# Patient Record
Sex: Female | Born: 1995 | Hispanic: No | Marital: Single
Health system: Southern US, Community
[De-identification: ages and names within clinical notes are randomized; demographics above are authoritative.]

## PROBLEM LIST (undated history)

## (undated) DIAGNOSIS — D649 Anemia, unspecified: Secondary | ICD-10-CM

---
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
## 2019-03-24 ENCOUNTER — Emergency Department (HOSPITAL_COMMUNITY)
Admission: EM | Admit: 2019-03-24 | Discharge: 2019-03-24 | Disposition: A | Discharge status: Home / Self Care | Payer: Self-pay | Attending: Emergency Medicine | Admitting: Emergency Medicine

## 2019-03-24 ENCOUNTER — Other Ambulatory Visit: Payer: Self-pay

## 2019-03-24 ENCOUNTER — Encounter (HOSPITAL_COMMUNITY): Payer: Self-pay | Admitting: Emergency Medicine

## 2019-03-24 DIAGNOSIS — J029 Acute pharyngitis, unspecified: Secondary | ICD-10-CM | POA: Insufficient documentation

## 2019-03-24 DIAGNOSIS — R05 Cough: Secondary | ICD-10-CM | POA: Insufficient documentation

## 2019-03-24 HISTORY — DX: Anemia, unspecified: D64.9

## 2019-03-24 LAB — GROUP A STREP BY PCR: Group A Strep by PCR: NOT DETECTED

## 2019-03-24 MED ORDER — DEXAMETHASONE SODIUM PHOSPHATE 10 MG/ML IJ SOLN
10.0000 mg | Freq: Once | INTRAMUSCULAR | Status: AC
  Administered 2019-03-24: 10 mg via INTRAMUSCULAR
  Filled 2019-03-24: qty 1

## 2019-03-24 MED ORDER — KETOROLAC TROMETHAMINE 15 MG/ML IJ SOLN
15.0000 mg | Freq: Once | INTRAMUSCULAR | Status: AC
  Administered 2019-03-24: 15 mg via INTRAMUSCULAR
  Filled 2019-03-24: qty 1

## 2019-03-24 MED ORDER — PREDNISONE 20 MG PO TABS: 40.0000 mg | ORAL_TABLET | Freq: Every day | ORAL | 0 refills | Status: DC

## 2019-03-24 NOTE — Discharge Instructions (Signed)
Please read and follow all provided instructions.  Your diagnoses today include:  1. Exudative pharyngitis     Tests performed today include:  Strep test: was negative for strep throat  Vital signs. See below for your results today.   Medications prescribed:   Prednisone - steroid medicine   It is best to take this medication in the morning to prevent sleeping problems. If you are diabetic, monitor your blood sugar closely and stop taking Prednisone if blood sugar is over 300. Take with food to prevent stomach upset.   Home care instructions:  Please read the educational materials provided and follow any instructions contained in this packet.  Follow-up instructions: Please follow-up with your primary care provider as needed for further evaluation of your symptoms.  Return instructions:   Please return to the Emergency Department if you experience worsening symptoms.   Return if you have worsening problems swallowing, your neck becomes swollen, you cannot swallow your saliva or your voice becomes muffled.   Return with high persistent fever, persistent vomiting, or if you have trouble breathing.   Please return if you have any other emergent concerns.  Additional Information:  Your vital signs today were: BP 119/88 (BP Location: Left Arm)   Pulse 77   Temp 99.2 F (37.3 C) (Oral)   Resp 16   SpO2 100%  If your blood pressure (BP) was elevated above 135/85 this visit, please have this repeated by your doctor within one month. --------------

## 2019-03-24 NOTE — ED Notes (Signed)
Patient verbalizes understanding of discharge instructions. Opportunity for questioning and answers were provided. Armband removed by staff, pt discharged from ED to home via POV  

## 2019-03-24 NOTE — ED Triage Notes (Signed)
Pt reports sore throat and swollen tonsils with exudate since last night. Also endorses headache but no other symptoms.

## 2019-03-24 NOTE — ED Provider Notes (Signed)
[facility] Provider Note   CSN: [id number] Arrival date & time: 03/24/19  1314     History Chief Complaint  Patient presents with  . Sore Throat    Nicole Novak is a 24 y.o. female.  Patient with past history of recurrent tonsillitis presents to the emergency department complaint of sore throat starting last night.  Patient has had decreased fluid and oral intake due to pain.  She is able to breathe without any difficulty.  She denies any fevers.  She has a mild cough.  No pain with movement of her neck.  No nausea, vomiting, diarrhea.  She states is been difficult to swallow because of the pain. The onset of this condition was acute. The course is constant.         Past Medical History:  Diagnosis Date  . Anemia     There are no problems to display for this patient.   History reviewed. No pertinent surgical history.   OB History   No obstetric history on file.     No family history on file.  Social History   Tobacco Use  . Smoking status: Not on file  Substance Use Topics  . Alcohol use: Not on file  . Drug use: Not on file    Home Medications Prior to Admission medications   Not on File    Allergies    Shellfish allergy  Review of Systems   Review of Systems  Constitutional: Negative for chills, fatigue and fever.  HENT: Positive for sore throat. Negative for congestion, ear pain, rhinorrhea and sinus pressure.   Eyes: Negative for redness.  Respiratory: Positive for cough. Negative for wheezing.   Gastrointestinal: Negative for abdominal pain, diarrhea, nausea and vomiting.  Genitourinary: Negative for dysuria.  Musculoskeletal: Negative for myalgias and neck stiffness.  Skin: Negative for rash.  Neurological: Negative for headaches.  Hematological: Negative for adenopathy.    Physical Exam Updated Vital Signs BP 119/88 (BP Location: Left Arm)   Pulse 77   Temp 99.2 F (37.3 C) (Oral)   Resp 16    SpO2 100%   Physical Exam Vitals and nursing note reviewed.  Constitutional:      Appearance: She is well-developed.  HENT:     Head: Normocephalic and atraumatic.     Nose: No congestion.     Mouth/Throat:     Mouth: Mucous membranes are moist.     Pharynx: Oropharyngeal exudate present. No uvula swelling.     Comments: No uvular deviation or signs of peritonsillar abscess. Eyes:     General:        Right eye: No discharge.        Left eye: No discharge.     Conjunctiva/sclera: Conjunctivae normal.  Neck:     Comments: Full range of motion of neck without any significant discomfort. Cardiovascular:     Rate and Rhythm: Normal rate and regular rhythm.     Heart sounds: Normal heart sounds.  Pulmonary:     Effort: Pulmonary effort is normal.     Breath sounds: Normal breath sounds.  Abdominal:     Palpations: Abdomen is soft.     Tenderness: There is no abdominal tenderness.  Musculoskeletal:     Cervical back: Normal range of motion and neck supple.  Skin:    General: Skin is warm and dry.  Neurological:     Mental Status: She is alert.     ED Results / Procedures /  Treatments   Labs (all labs ordered are listed, but only abnormal results are displayed) Labs Reviewed  GROUP A STREP BY PCR    EKG None  Radiology No results found.  Procedures Procedures (including critical care time)  Medications Ordered in ED Medications  ketorolac (TORADOL) 15 MG/ML injection 15 mg (15 mg Intramuscular Given 03/24/19 1540)  dexamethasone (DECADRON) injection 10 mg (10 mg Intramuscular Given 03/24/19 1542)    ED Course  I have reviewed the triage vital signs and the nursing notes.  Pertinent labs & imaging results that were available during my care of the patient were reviewed by me and considered in my medical decision making (see chart for details).  Patient seen and examined.  Strep negative.  Will give IM Toradol and Decadron, p.o. challenge.  Anticipate discharged  home with symptomatic treatment.  Vital signs reviewed and are as follows: BP 119/88 (BP Location: Left Arm)   Pulse 77   Temp 99.2 F (37.3 C) (Oral)   Resp 16   SpO2 100%   4:34 PM patient received medications and is feeling a bit better.  She was able to drink a cup of juice.  Plan for discharged home.  Encouraged return with worsening pain, uncontrolled pain, inability to swallow, fever, pain with movement.     MDM Rules/Calculators/A&P                      Patient with exudative pharyngitis.  No signs of deep space infection of the neck.  1-2/4 Centor criteria.  Medications for antibiotics at this time.  No signs of peritonsillar abscess.  Doubt retropharyngeal abscess, epiglottitis, gonococcal pharyngitis.   Final Clinical Impression(s) / ED Diagnoses Final diagnoses:  Exudative pharyngitis    Rx / DC Orders ED Discharge Orders         Ordered    predniSONE (DELTASONE) 20 MG tablet  Daily     03/24/19 1632           Renne Crigler, PA-C 03/24/19 1635    Terrilee Files, MD 03/25/19 1133

## 2019-08-16 ENCOUNTER — Ambulatory Visit (HOSPITAL_COMMUNITY)
Admission: EM | Admit: 2019-08-16 | Discharge: 2019-08-16 | Disposition: A | Discharge status: Home / Self Care | Payer: Self-pay

## 2019-08-16 ENCOUNTER — Encounter (HOSPITAL_COMMUNITY): Payer: Self-pay | Admitting: Emergency Medicine

## 2019-08-16 ENCOUNTER — Other Ambulatory Visit: Payer: Self-pay

## 2019-08-16 DIAGNOSIS — Z3202 Encounter for pregnancy test, result negative: Secondary | ICD-10-CM

## 2019-08-16 DIAGNOSIS — N898 Other specified noninflammatory disorders of vagina: Secondary | ICD-10-CM | POA: Insufficient documentation

## 2019-08-16 LAB — POCT URINALYSIS DIP (DEVICE)
Glucose, UA: 100 mg/dL — AB
Hgb urine dipstick: NEGATIVE
Ketones, ur: NEGATIVE mg/dL
Leukocytes,Ua: NEGATIVE
Nitrite: NEGATIVE
Protein, ur: NEGATIVE mg/dL
Specific Gravity, Urine: >=1.03 (ref 1.005–1.030)
Urobilinogen, UA: 0.2 mg/dL (ref 0.0–1.0)
pH: 5.5 (ref 5.0–8.0)

## 2019-08-16 LAB — POC URINE PREG, ED: Preg Test, Ur: NEGATIVE

## 2019-08-16 MED ORDER — FLUCONAZOLE 150 MG PO TABS
150.0000 mg | ORAL_TABLET | Freq: Once | ORAL | 0 refills | Status: AC
Start: 2019-08-16 — End: 2019-08-16

## 2019-08-16 MED ORDER — METRONIDAZOLE 500 MG PO TABS
500.0000 mg | ORAL_TABLET | Freq: Two times a day (BID) | ORAL | 0 refills | Status: AC
Start: 2019-08-16 — End: 2019-08-23

## 2019-08-16 NOTE — Discharge Instructions (Signed)
Begin metronidazole and diflucan for BV and yeast, I will call to let you know results of urine  We are testing you for Gonorrhea, Chlamydia, Trichomonas, Yeast and Bacterial Vaginosis. We will call you if anything is positive and let you know if you require any further treatment. Please inform partners of any positive results.   Please return if symptoms not improving with treatment, development of fever, nausea, vomiting, abdominal pain.

## 2019-08-16 NOTE — ED Triage Notes (Signed)
Pt c/o vaginal odor and discharge x 1 week. She states she has had a new partner and frequently gets BV.

## 2019-08-16 NOTE — ED Provider Notes (Signed)
[facility]    CSN: [id number] Arrival date & time: 08/16/19  1123      History   Chief Complaint Chief Complaint  Patient presents with   Vaginitis    HPI Nicole Novak is a 24 y.o. female no significant past medical history presenting today for evaluation of odor and discharge.  Patient reports over the past week she has had increased discharge and odor which feels similar to prior bacterial vaginosis.  She reports that she gets this frequently and she recently has a new partner which she feels has triggered it.  She also is concerned about possible UTI as she has has had sensation of incomplete voiding.  Has history of recurrent UTI as well.  Denies fever, nausea vomiting or abdominal pain.  Denies back pain.  Denies any rashes or lesions.  HPI  Past Medical History:  Diagnosis Date   Anemia     There are no problems to display for this patient.   History reviewed. No pertinent surgical history.  OB History   No obstetric history on file.      Home Medications    Prior to Admission medications   Medication Sig Start Date End Date Taking? Authorizing Provider  Norethin Ace-Eth Estrad-FE (LOESTRIN FE 1/20 PO) Take by mouth.   Yes [provider]  fluconazole (DIFLUCAN) 150 MG tablet Take 1 tablet (150 mg total) by mouth once for 1 dose. 08/16/19 08/16/19  Jamont Mellin C, PA-C  metroNIDAZOLE (FLAGYL) 500 MG tablet Take 1 tablet (500 mg total) by mouth 2 (two) times daily for 7 days. 08/16/19 08/23/19  Darl Brisbin, Elesa Hacker, PA-C    Family History No family history on file.  Social History Social History   Tobacco Use   Smoking status: Not on file  Substance Use Topics   Alcohol use: Not on file   Drug use: Not on file     Allergies   Shellfish allergy   Review of Systems Review of Systems  Constitutional: Negative for fever.  Respiratory: Negative for shortness of breath.   Cardiovascular: Negative for chest pain.   Gastrointestinal: Negative for abdominal pain, diarrhea, nausea and vomiting.  Genitourinary: Positive for difficulty urinating, dysuria and vaginal discharge. Negative for flank pain, genital sores, hematuria, menstrual problem, vaginal bleeding and vaginal pain.  Musculoskeletal: Negative for back pain.  Skin: Negative for rash.  Neurological: Negative for dizziness, light-headedness and headaches.     Physical Exam Triage Vital Signs ED Triage Vitals  Enc Vitals Group     BP 08/16/19 1217 110/68     Pulse --      Resp 08/16/19 1217 16     Temp 08/16/19 1217 98.7 F (37.1 C)     Temp Source 08/16/19 1217 Oral     SpO2 08/16/19 1217 100 %     Weight --      Height --      Head Circumference --      Peak Flow --      Pain Score 08/16/19 1213 0     Pain Loc --      Pain Edu? --      Excl. in [place]? --    No data found.  Updated Vital Signs BP 110/68 (BP Location: Right Arm)    Temp 98.7 F (37.1 C) (Oral)    Resp 16    LMP 08/02/2019    SpO2 100%   Visual Acuity Right Eye Distance:   Left Eye Distance:  Bilateral Distance:    Right Eye Near:   Left Eye Near:    Bilateral Near:     Physical Exam Vitals and nursing note reviewed.  Constitutional:      Appearance: She is well-developed.     Comments: No acute distress  HENT:     Head: Normocephalic and atraumatic.     Nose: Nose normal.  Eyes:     Conjunctiva/sclera: Conjunctivae normal.  Cardiovascular:     Rate and Rhythm: Normal rate.  Pulmonary:     Effort: Pulmonary effort is normal. No respiratory distress.  Abdominal:     General: There is no distension.     Comments: Soft, nondistended, mild tenderness to palpation to left lower quadrant and mid to suprapubic area, negative rebound, negative Rovsing, negative McBurney's  Musculoskeletal:        General: Normal range of motion.     Cervical back: Neck supple.  Skin:    General: Skin is warm and dry.  Neurological:     Mental Status: She is alert  and oriented to person, place, and time.      UC Treatments / Results  Labs (all labs ordered are listed, but only abnormal results are displayed) Labs Reviewed  POCT URINALYSIS DIP (DEVICE) - Abnormal; Notable for the following components:      Result Value   Glucose, UA 100 (*)    Bilirubin Urine SMALL (*)    All other components within normal limits  URINE CULTURE  POC URINE PREG, ED  CERVICOVAGINAL ANCILLARY ONLY  CYTOLOGY, (ORAL, ANAL, URETHRAL) ANCILLARY ONLY    EKG   Radiology No results found.  Procedures Procedures (including critical care time)  Medications Ordered in UC Medications - No data to display  Initial Impression / Assessment and Plan / UC Course  I have reviewed the triage vital signs and the nursing notes.  Pertinent labs & imaging results that were available during my care of the patient were reviewed by me and considered in my medical decision making (see chart for details).  Clinical Course as of Aug 16 1354  Fri Aug 16, 2019  1320 Urine culture [HW]    Clinical Course User Index [HW] Myelle Poteat, Verdel C, PA-C    Empirically treating for BV and yeast today with metronidazole and.  Vaginal swab pending for further evaluation and confirmation of BV in regards to discharge.  Screening for STDs and pharynx.  UA unremarkable, will send for culture to further r/o UTI as patient has been taking azo.  Discussed strict return precautions. Patient verbalized understanding and is agreeable with plan.  Final Clinical Impressions(s) / UC Diagnoses   Final diagnoses:  Vaginal discharge     Discharge Instructions     Begin metronidazole and diflucan for BV and yeast, I will call to let you know results of urine  We are testing you for Gonorrhea, Chlamydia, Trichomonas, Yeast and Bacterial Vaginosis. We will call you if anything is positive and let you know if you require any further treatment. Please inform partners of any positive results.    Please return if symptoms not improving with treatment, development of fever, nausea, vomiting, abdominal pain.     ED Prescriptions    Medication Sig Dispense Auth. Provider   metroNIDAZOLE (FLAGYL) 500 MG tablet Take 1 tablet (500 mg total) by mouth 2 (two) times daily for 7 days. 14 tablet Isma Tietje C, PA-C   fluconazole (DIFLUCAN) 150 MG tablet Take 1 tablet (150 mg total) by  mouth once for 1 dose. 2 tablet Jernard Reiber, [place] C, PA-C     PDMP not reviewed this encounter.   Lew Dawes, PA-C 08/16/19 1357

## 2019-08-17 LAB — URINE CULTURE: Culture: <10000 — AB

## 2019-08-19 LAB — CERVICOVAGINAL ANCILLARY ONLY
Bacterial Vaginitis (gardnerella): POSITIVE — AB
Candida Glabrata: NEGATIVE
Candida Vaginitis: NEGATIVE
Chlamydia: NEGATIVE
Comment: NEGATIVE
Comment: NEGATIVE
Comment: NEGATIVE
Comment: NEGATIVE
Comment: NEGATIVE
Comment: NORMAL
Neisseria Gonorrhea: NEGATIVE
Trichomonas: NEGATIVE

## 2019-08-19 LAB — CYTOLOGY, (ORAL, ANAL, URETHRAL) ANCILLARY ONLY
Chlamydia: NEGATIVE
Comment: NEGATIVE
Comment: NEGATIVE
Comment: NORMAL
Neisseria Gonorrhea: NEGATIVE
Trichomonas: NEGATIVE

## 2019-10-31 ENCOUNTER — Ambulatory Visit: Payer: Self-pay

## 2019-11-07 ENCOUNTER — Emergency Department (HOSPITAL_COMMUNITY)
Admission: EM | Admit: 2019-11-07 | Discharge: 2019-11-07 | Disposition: A | Discharge status: Home / Self Care | Payer: Self-pay | Attending: Emergency Medicine | Admitting: Emergency Medicine

## 2019-11-07 ENCOUNTER — Emergency Department (HOSPITAL_COMMUNITY): Payer: Self-pay

## 2019-11-07 ENCOUNTER — Other Ambulatory Visit: Payer: Self-pay

## 2019-11-07 DIAGNOSIS — Y939 Activity, unspecified: Secondary | ICD-10-CM | POA: Insufficient documentation

## 2019-11-07 DIAGNOSIS — Y999 Unspecified external cause status: Secondary | ICD-10-CM | POA: Insufficient documentation

## 2019-11-07 DIAGNOSIS — G930 Cerebral cysts: Secondary | ICD-10-CM

## 2019-11-07 DIAGNOSIS — E876 Hypokalemia: Secondary | ICD-10-CM

## 2019-11-07 DIAGNOSIS — Y929 Unspecified place or not applicable: Secondary | ICD-10-CM | POA: Insufficient documentation

## 2019-11-07 DIAGNOSIS — T1490XA Injury, unspecified, initial encounter: Secondary | ICD-10-CM

## 2019-11-07 DIAGNOSIS — Z20822 Contact with and (suspected) exposure to covid-19: Secondary | ICD-10-CM | POA: Insufficient documentation

## 2019-11-07 DIAGNOSIS — S30810A Abrasion of lower back and pelvis, initial encounter: Secondary | ICD-10-CM

## 2019-11-07 DIAGNOSIS — R0781 Pleurodynia: Secondary | ICD-10-CM | POA: Insufficient documentation

## 2019-11-07 LAB — COMPREHENSIVE METABOLIC PANEL
ALT: 17 U/L (ref 0–44)
AST: 21 U/L (ref 15–41)
Albumin: 4 g/dL (ref 3.5–5.0)
Alkaline Phosphatase: 48 U/L (ref 38–126)
Anion gap: 13 (ref 5–15)
BUN: 12 mg/dL (ref 6–20)
CO2: 19 mmol/L — ABNORMAL LOW (ref 22–32)
Calcium: 9.2 mg/dL (ref 8.9–10.3)
Chloride: 105 mmol/L (ref 98–111)
Creatinine, Ser: 1.03 mg/dL — ABNORMAL HIGH (ref 0.44–1.00)
GFR calc Af Amer: >60 mL/min (ref >60)
GFR calc non Af Amer: >60 mL/min (ref >60)
Glucose, Bld: 118 mg/dL — ABNORMAL HIGH (ref 70–99)
Potassium: 2.8 mmol/L — ABNORMAL LOW (ref 3.5–5.1)
Sodium: 137 mmol/L (ref 135–145)
Total Bilirubin: 0.8 mg/dL (ref 0.3–1.2)
Total Protein: 6.7 g/dL (ref 6.5–8.1)

## 2019-11-07 LAB — CBC
HCT: 36.3 % (ref 36.0–46.0)
Hemoglobin: 12.1 g/dL (ref 12.0–15.0)
MCH: 27.9 pg (ref 26.0–34.0)
MCHC: 33.3 g/dL (ref 30.0–36.0)
MCV: 83.8 fL (ref 80.0–100.0)
Platelets: 355 10*3/uL (ref 150–400)
RBC: 4.33 MIL/uL (ref 3.87–5.11)
RDW: 12.4 % (ref 11.5–15.5)
WBC: 7.8 10*3/uL (ref 4.0–10.5)

## 2019-11-07 LAB — I-STAT BETA HCG BLOOD, ED (MC, WL, AP ONLY): I-stat hCG, quantitative: <5 m[IU]/mL (ref <5)

## 2019-11-07 LAB — LACTIC ACID, PLASMA: Lactic Acid, Venous: 1.9 mmol/L (ref 0.5–1.9)

## 2019-11-07 LAB — SARS CORONAVIRUS 2 BY RT PCR (HOSPITAL ORDER, PERFORMED IN ~~LOC~~ HOSPITAL LAB): SARS Coronavirus 2: NEGATIVE

## 2019-11-07 LAB — I-STAT CHEM 8, ED
BUN: 13 mg/dL (ref 6–20)
Calcium, Ion: 1.06 mmol/L — ABNORMAL LOW (ref 1.15–1.40)
Chloride: 106 mmol/L (ref 98–111)
Creatinine, Ser: 0.8 mg/dL (ref 0.44–1.00)
Glucose, Bld: 115 mg/dL — ABNORMAL HIGH (ref 70–99)
HCT: 35 % — ABNORMAL LOW (ref 36.0–46.0)
Hemoglobin: 11.9 g/dL — ABNORMAL LOW (ref 12.0–15.0)
Potassium: 2.9 mmol/L — ABNORMAL LOW (ref 3.5–5.1)
Sodium: 140 mmol/L (ref 135–145)
TCO2: 20 mmol/L — ABNORMAL LOW (ref 22–32)

## 2019-11-07 LAB — ETHANOL: Alcohol, Ethyl (B): <10 mg/dL (ref <10)

## 2019-11-07 LAB — PROTIME-INR
INR: 1.2 (ref 0.8–1.2)
Prothrombin Time: 14.8 seconds (ref 11.4–15.2)

## 2019-11-07 LAB — SAMPLE TO BLOOD BANK

## 2019-11-07 MED ORDER — POTASSIUM CHLORIDE CRYS ER 20 MEQ PO TBCR
40.0000 meq | EXTENDED_RELEASE_TABLET | Freq: Once | ORAL | Status: AC
  Administered 2019-11-07: 40 meq via ORAL
  Filled 2019-11-07: qty 2

## 2019-11-07 MED ORDER — LACTATED RINGERS IV BOLUS
1000.0000 mL | Freq: Once | INTRAVENOUS | Status: AC
  Administered 2019-11-07: 1000 mL via INTRAVENOUS

## 2019-11-07 MED ORDER — OXYCODONE-ACETAMINOPHEN 5-325 MG PO TABS: 2.0000 | ORAL_TABLET | Freq: Once | ORAL | Status: DC

## 2019-11-07 MED ORDER — HYDROMORPHONE HCL 1 MG/ML IJ SOLN
1.0000 mg | Freq: Once | INTRAMUSCULAR | Status: AC
  Administered 2019-11-07: 1 mg via INTRAVENOUS
  Filled 2019-11-07: qty 1

## 2019-11-07 MED ORDER — POTASSIUM CHLORIDE CRYS ER 20 MEQ PO TBCR: 20.0000 meq | EXTENDED_RELEASE_TABLET | Freq: Every day | ORAL | 0 refills | Status: AC

## 2019-11-07 MED ORDER — POTASSIUM CHLORIDE 10 MEQ/100ML IV SOLN
10.0000 meq | INTRAVENOUS | Status: AC
  Administered 2019-11-07 (×2): 10 meq via INTRAVENOUS
  Filled 2019-11-07 (×2): qty 100

## 2019-11-07 MED ORDER — HYDROCODONE-ACETAMINOPHEN 5-325 MG PO TABS: 1.0000 | ORAL_TABLET | Freq: Four times a day (QID) | ORAL | 0 refills | Status: AC | PRN

## 2019-11-07 MED ORDER — HYDROMORPHONE HCL 1 MG/ML IJ SOLN
INTRAMUSCULAR | Status: AC
  Filled 2019-11-07: qty 1

## 2019-11-07 MED ORDER — IOHEXOL 300 MG/ML  SOLN
100.0000 mL | Freq: Once | INTRAMUSCULAR | Status: AC | PRN
  Administered 2019-11-07: 100 mL via INTRAVENOUS

## 2019-11-07 MED ORDER — HYDROMORPHONE HCL 1 MG/ML IJ SOLN
1.0000 mg | Freq: Once | INTRAMUSCULAR | Status: AC
  Administered 2019-11-07: 1 mg via INTRAVENOUS

## 2019-11-07 NOTE — Progress Notes (Signed)
At Pt discharge, CSW was able to contact boyfriend via Facebook to ascertain his physical location at the [employer]. CSW coordinated with boyfriend and arranged transportation to [employer].
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
## 2019-11-07 NOTE — ED Notes (Signed)
Back from ct.

## 2019-11-07 NOTE — Progress Notes (Signed)
Orthopedic Tech Progress Note Patient Details:  Nicole Novak [birth year] [id number] Level 2 Trauma Patient ID: [name], female   DOB: 1995/11/08, 24 y.o.   MRN: [id number]   [name] 11/07/2019, 5:17 PM

## 2019-11-07 NOTE — Social Work (Signed)
CSW met with Pt at bedside. CSW and Pt worked to reach Pt's boyfriend via [place]. Boyfriend, Darrick Engineer, water, is still at work. Pt and boyfriend have alternate address of friend should Pt get discharged before Mr. Smith Mince arrives home:  [place].  CSW is able to contact boyfriend via Facebook.  CSW will continue to follow for discharge needs.

## 2019-11-07 NOTE — Discharge Instructions (Signed)
Your scan showed no posttraumatic injuries.  You may have a cyst in your brain that needs MRI outpatient.    Your potassium is low so please take potassium as prescribed.  Repeat potassium level in a week.  Take Tylenol for pain and take hydrocodone for severe pain.  See your doctor for follow-up  Return to ER if you have worse abdominal pain, headache, vomiting.
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
## 2019-11-11 ENCOUNTER — Telehealth: Payer: No Typology Code available for payment source | Admitting: Nurse Practitioner

## 2019-11-11 DIAGNOSIS — N3000 Acute cystitis without hematuria: Secondary | ICD-10-CM

## 2019-11-11 MED ORDER — NITROFURANTOIN MONOHYD MACRO 100 MG PO CAPS
100.0000 mg | ORAL_CAPSULE | Freq: Two times a day (BID) | ORAL | 0 refills | Status: DC
Start: 2019-11-11 — End: 2021-08-03

## 2019-11-11 NOTE — Progress Notes (Signed)

## 2019-11-30 ENCOUNTER — Telehealth: Payer: No Typology Code available for payment source | Admitting: Nurse Practitioner

## 2019-11-30 DIAGNOSIS — N3001 Acute cystitis with hematuria: Secondary | ICD-10-CM

## 2019-11-30 MED ORDER — SULFAMETHOXAZOLE-TRIMETHOPRIM 800-160 MG PO TABS: 1.0000 | ORAL_TABLET | Freq: Two times a day (BID) | ORAL | 0 refills | Status: DC

## 2019-11-30 MED ORDER — PHENAZOPYRIDINE HCL 100 MG PO TABS
100.0000 mg | ORAL_TABLET | Freq: Three times a day (TID) | ORAL | 0 refills | Status: DC | PRN
Start: 2019-11-30 — End: 2019-11-30

## 2019-11-30 MED ORDER — PHENAZOPYRIDINE HCL 100 MG PO TABS: 100.0000 mg | ORAL_TABLET | Freq: Three times a day (TID) | ORAL | 0 refills | Status: DC | PRN

## 2019-11-30 MED ORDER — SULFAMETHOXAZOLE-TRIMETHOPRIM 800-160 MG PO TABS
1.0000 | ORAL_TABLET | Freq: Two times a day (BID) | ORAL | 0 refills | Status: DC
Start: 2019-11-30 — End: 2019-11-30

## 2019-11-30 NOTE — Progress Notes (Signed)
We are sorry that you are not feeling well.  Here is how we plan to help!  Based on what you shared with me it looks like you most likely have a simple urinary tract infection.  A UTI (Urinary Tract Infection) is a bacterial infection of the bladder.  Most cases of urinary tract infections are simple to treat but a key part of your care is to encourage you to drink plenty of fluids and watch your symptoms carefully.  I have prescribed Bactrim DS One tablet twice a day for 7 days. And pyridium 3x a day for 3 days. Your symptoms should gradually improve. Call us if the burning in your urine worsens, you develop worsening fever, back pain or pelvic pain or if your symptoms do not resolve after completing the antibiotic.  Urinary tract infections can be prevented by drinking plenty of water to keep your body hydrated.  Also be sure when you wipe, wipe from front to back and don't hold it in!  If possible, empty your bladder every 4 hours.  Your e-visit answers were reviewed by a board certified advanced clinical practitioner to complete your personal care plan.  Depending on the condition, your plan could have included both over the counter or prescription medications.  If there is a problem please reply  once you have received a response from your provider.  Your safety is important to [place].  If you have drug allergies check your prescription carefully.    You can use MyChart to ask questions about today's visit, request a non-urgent call back, or ask for a work or school excuse for 24 hours related to this e-Visit. If it has been greater than 24 hours you will need to follow up with your provider, or enter a new e-Visit to address those concerns.   You will get an e-mail in the next two days asking about your experience.  I hope that your e-visit has been valuable and will speed your recovery. Thank you for using e-visits.   5-10 minutes spent reviewing and documenting in chart.

## 2019-11-30 NOTE — Addendum Note (Signed)
Addended by: Bennie Pierini on: 11/30/2019 07:46 PM   Modules accepted: Orders

## 2020-04-16 ENCOUNTER — Ambulatory Visit: Payer: Self-pay

## 2020-04-16 ENCOUNTER — Telehealth: Payer: No Typology Code available for payment source | Admitting: Physician Assistant

## 2020-04-16 DIAGNOSIS — N760 Acute vaginitis: Secondary | ICD-10-CM

## 2020-04-16 MED ORDER — FLUCONAZOLE 150 MG PO TABS
150.0000 mg | ORAL_TABLET | Freq: Once | ORAL | 0 refills | Status: AC
Start: 2020-04-16 — End: 2020-04-16

## 2020-04-16 MED ORDER — METRONIDAZOLE 500 MG PO TABS
500.0000 mg | ORAL_TABLET | Freq: Two times a day (BID) | ORAL | 0 refills | Status: DC
Start: 2020-04-16 — End: 2020-08-14

## 2020-04-16 NOTE — Progress Notes (Signed)
Hi Nicole Novak, Flagyl does not treat common UTIs; however, because you are having vaginal symptoms (discharge and itching), rather than urinary symptoms (burning, frequency), it is best to try the prescribed medications first to help prevent overmedicating.   If your symptoms persist or worsen after completing the Flagyl and Diflucan sent to your pharmacy today, it is recommended you follow up in person with a primary care provider, gynecologist, health department or urgent care for further testing and treatment.

## 2020-04-16 NOTE — Progress Notes (Signed)
Thank you for that information, Daryl!   It is important to have routine STD testing done.  Please read below for your treatment plan.  If your symptoms fail to improve following treatment, please go to an Urgent Care for testing.    Also, please consider establishing care with a GYN or Primary Care Provider. If you get BV infections often, there are other treatment methods to consider. Also, you may be due for cervical cancer screening.  (If you do not have a PCP, [facility] offers a free physician referral service available at [phone]. Our trained staff has the experience, knowledge and resources to put you in touch with a physician who is right for you.)    Based on what you shared with me it looks like you: May have a vaginosis due to bacteria  Vaginosis is an inflammation of the vagina that can result in discharge, itching and pain. The cause is usually a change in the normal balance of vaginal bacteria or an infection. Vaginosis can also result from reduced estrogen levels after menopause.  The most common causes of vaginosis are:   Bacterial vaginosis which results from an overgrowth of one on several organisms that are normally present in your vagina.   Yeast infections which are caused by a naturally occurring fungus called candida.   Vaginal atrophy (atrophic vaginosis) which results from the thinning of the vagina from reduced estrogen levels after menopause.   Trichomoniasis which is caused by a parasite and is commonly transmitted by sexual intercourse.  Factors that increase your risk of developing vaginosis include: Marland Kitchen Medications, such as antibiotics and steroids . Uncontrolled diabetes . Use of hygiene products such as bubble bath, vaginal spray or vaginal deodorant . Douching . Wearing damp or tight-fitting clothing . Using an intrauterine device (IUD) for birth control . Hormonal changes, such as those associated with pregnancy, birth control pills or  menopause . Sexual activity . Having a sexually transmitted infection  Your treatment plan is Metronidazole or Flagyl 500mg  twice a day for 7 days.  I have electronically sent this prescription into the pharmacy that you have chosen.  I will also prescribe Diflucan in case you develop signs of a yeast infection following treatment.   Be sure to take all of the medication as directed. Stop taking any medication if you develop a rash, tongue swelling or shortness of breath. Mothers who are breast feeding should consider pumping and discarding their breast milk while on these antibiotics. However, there is no consensus that infant exposure at these doses would be harmful.  Remember that medication creams can weaken latex condoms.   HOME CARE:  Good hygiene may prevent some types of vaginosis from recurring and may relieve some symptoms:  . Avoid baths, hot tubs and whirlpool spas. Rinse soap from your outer genital area after a shower, and dry the area well to prevent irritation. Don't use scented or harsh soaps, such as those with deodorant or antibacterial action. Marland Kitchen Avoid irritants. These include scented tampons and pads. . Wipe from front to back after using the toilet. Doing so avoids spreading fecal bacteria to your vagina.  Other things that may help prevent vaginosis include:  Marland Kitchen Don't douche. Your vagina doesn't require cleansing other than normal bathing. Repetitive douching disrupts the normal organisms that reside in the vagina and can actually increase your risk of vaginal infection. Douching won't clear up a vaginal infection. . Use a latex condom. Both female and female latex condoms  may help you avoid infections spread by sexual contact. . Wear cotton underwear. Also wear pantyhose with a cotton crotch. If you feel comfortable without it, skip wearing underwear to bed. Yeast thrives in [employer] Your symptoms should improve in the next day or two.  GET HELP RIGHT AWAY  IF:  . You have pain in your lower abdomen ( pelvic area or over your ovaries) . You develop nausea or vomiting . You develop a fever . Your discharge changes or worsens . You have persistent pain with intercourse . You develop shortness of breath, a rapid pulse, or you faint.  These symptoms could be signs of problems or infections that need to be evaluated by a medical provider now.  MAKE SURE YOU    Understand these instructions.  Will watch your condition.  Will get help right away if you are not doing well or get worse.  Your e-visit answers were reviewed by a board certified advanced clinical practitioner to complete your personal care plan. Depending upon the condition, your plan could have included both over the counter or prescription medications. Please review your pharmacy choice to make sure that you have choses a pharmacy that is open for you to pick up any needed prescription, Your safety is important to [place]. If you have drug allergies check your prescription carefully.   You can use MyChart to ask questions about today's visit, request a non-urgent call back, or ask for a work or school excuse for 24 hours related to this e-Visit. If it has been greater than 24 hours you will need to follow up with your provider, or enter a new e-Visit to address those concerns. You will get a MyChart message within the next two days asking about your experience. I hope that your e-visit has been valuable and will speed your recovery.  Greater than 5 minutes, yet less than 10 minutes of time have been spent researching, coordinating and implementing care for this patient today.

## 2020-08-14 ENCOUNTER — Encounter: Payer: Self-pay | Admitting: Physician Assistant

## 2020-08-14 ENCOUNTER — Telehealth: Payer: Self-pay | Admitting: Physician Assistant

## 2020-08-14 DIAGNOSIS — B9689 Other specified bacterial agents as the cause of diseases classified elsewhere: Secondary | ICD-10-CM

## 2020-08-14 DIAGNOSIS — N760 Acute vaginitis: Secondary | ICD-10-CM

## 2020-08-14 MED ORDER — METRONIDAZOLE 500 MG PO TABS: 500.0000 mg | ORAL_TABLET | Freq: Two times a day (BID) | ORAL | 0 refills | Status: AC

## 2020-08-14 NOTE — Progress Notes (Signed)
Nicole Novak, Nicole Novak are scheduled for a virtual visit with your provider today.    Just as we do with appointments in the office, we must obtain your consent to participate.  Your consent will be active for this visit and any virtual visit you may have with one of our providers in the next 365 days.    If you have a MyChart account, I can also send a copy of this consent to you electronically.  All virtual visits are billed to your insurance company just like a traditional visit in the office.  As this is a virtual visit, video technology does not allow for your provider to perform a traditional examination.  This may limit your provider's ability to fully assess your condition.  If your provider identifies any concerns that need to be evaluated in person or the need to arrange testing such as labs, EKG, etc, we will make arrangements to do so.    Although advances in technology are sophisticated, we cannot ensure that it will always work on either your end or our end.  If the connection with a video visit is poor, we may have to switch to a telephone visit.  With either a video or telephone visit, we are not always able to ensure that we have a secure connection.   I need to obtain your verbal consent now.   Are you willing to proceed with your visit today?   Nicole Novak has provided verbal consent on 08/14/2020 for a virtual visit (video or telephone).   Nicole Novak 08/14/2020  2:36 PM    MyChart Video Visit    Virtual Visit via Video Note   This visit type was conducted due to national recommendations for restrictions regarding the COVID-19 Pandemic (e.g. social distancing) in an effort to limit this patient's exposure and mitigate transmission in our community. This patient is at least at moderate risk for complications without adequate follow up. This format is felt to be most appropriate for this patient at this time. Physical exam was limited by quality of the video and audio  technology used for the visit.   Patient location: Home Provider location: Home office in [place]  I discussed the limitations of evaluation and management by telemedicine and the availability of in person appointments. The patient expressed understanding and agreed to proceed.  Patient: Nicole Novak   DOB: 09/13/1995   25 y.o. Female  MRN: [id number] Visit Date: 08/14/2020  Today's healthcare provider: Margaretann Loveless, Novak   No chief complaint on file.  Subjective    Vaginal Discharge The patient's primary symptoms include a genital odor and vaginal discharge. The patient's pertinent negatives include no genital itching or pelvic pain. This is a recurrent problem. The current episode started in the past 7 days. The problem occurs constantly. The problem has been unchanged. The patient is experiencing no pain. She is not pregnant. Pertinent negatives include no dysuria, flank pain or frequency.   Patient does report recently completing doxycycline. Felt she had a yeast infection, but those symptoms have cleared. She then had her menstrual cycle and reports discharge changed to whitish-yellow, milky, thin, and having a slight foul odor. 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  There are no problems to display for this patient.  Past Medical History:  Diagnosis Date   Anemia       Medications: Outpatient Medications Prior to Visit  Medication Sig   HYDROcodone-acetaminophen (NORCO/VICODIN) 5-325 MG  tablet Take 1 tablet by mouth every 6 (six) hours as needed.   metroNIDAZOLE (FLAGYL) 500 MG tablet Take 1 tablet (500 mg total) by mouth 2 (two) times daily with a meal. DO NOT CONSUME ALCOHOL WHILE TAKING THIS MEDICATION.   nitrofurantoin, macrocrystal-monohydrate, (MACROBID) 100 MG capsule Take 1 capsule (100 mg total) by mouth 2 (two) times daily. 1 po BId   Norethin Ace-Eth Estrad-FE (LOESTRIN FE 1/20 PO) Take by mouth.   Norethin-Eth  Estrad-Fe Biphas (LO LOESTRIN FE PO) Take 1 tablet by mouth daily.   phenazopyridine (PYRIDIUM) 100 MG tablet Take 1 tablet (100 mg total) by mouth 3 (three) times daily as needed for pain.   potassium chloride SA (KLOR-CON) 20 MEQ tablet Take 1 tablet (20 mEq total) by mouth daily.   sulfamethoxazole-trimethoprim (BACTRIM DS) 800-160 MG tablet Take 1 tablet by mouth 2 (two) times daily.   No facility-administered medications prior to visit.    Review of Systems  Constitutional: Negative.   Respiratory: Negative.    Gastrointestinal: Negative.   Genitourinary:  Positive for vaginal discharge. Negative for dysuria, flank pain, frequency, genital sores, menstrual problem and pelvic pain.   Last CBC Lab Results  Component Value Date   WBC 7.8 11/07/2019   HGB 12.1 11/07/2019   HCT 36.3 11/07/2019   MCV 83.8 11/07/2019   MCH 27.9 11/07/2019   RDW 12.4 11/07/2019   PLT 355 11/07/2019   Last metabolic panel Lab Results  Component Value Date   GLUCOSE 118 (H) 11/07/2019   NA 137 11/07/2019   K 2.8 (L) 11/07/2019   CL 105 11/07/2019   CO2 19 (L) 11/07/2019   BUN 12 11/07/2019   CREATININE 1.03 (H) 11/07/2019   GFRNONAA >60 11/07/2019   GFRAA >60 11/07/2019   CALCIUM 9.2 11/07/2019   PROT 6.7 11/07/2019   ALBUMIN 4.0 11/07/2019   BILITOT 0.8 11/07/2019   ALKPHOS 48 11/07/2019   AST 21 11/07/2019   ALT 17 11/07/2019   ANIONGAP 13 11/07/2019      Objective    There were no vitals taken for this visit. BP Readings from Last 3 Encounters:  11/07/19 105/68  08/16/19 110/68  03/24/19 111/69   Wt Readings from Last 3 Encounters:  11/07/19 150 lb (68 kg)      Physical Exam Vitals reviewed.  Constitutional:      General: She is not in acute distress.    Appearance: Normal appearance. She is well-developed. She is not ill-appearing.  HENT:     Head: Normocephalic and atraumatic.  Pulmonary:     Effort: Pulmonary effort is normal. No respiratory distress.   Musculoskeletal:     Cervical back: Normal range of motion and neck supple.  Neurological:     Mental Status: She is alert.  Psychiatric:        Mood and Affect: Mood normal.        Behavior: Behavior normal.        Thought Content: Thought content normal.        Judgment: Judgment normal.       Assessment & Plan     1. BV (bacterial vaginosis) - Suspected BV - Metronidazole provided as below, take until completed - If not improving, or worsening, please seek in-person evaluation for further testing - metroNIDAZOLE (FLAGYL) 500 MG tablet; Take 1 tablet (500 mg total) by mouth 2 (two) times daily for 7 days.  Dispense: 14 tablet; Refill: 0   No follow-ups on file.  I discussed the assessment and treatment plan with the patient. The patient was provided an opportunity to ask questions and all were answered. The patient agreed with the plan and demonstrated an understanding of the instructions.   The patient was advised to call back or seek an in-person evaluation if the symptoms worsen or if the condition fails to improve as anticipated.  I provided 12 minutes of face-to-face time during this encounter via MyChart Video enabled encounter.   [facility] [phone] (phone) [phone] (fax)  [facility]

## 2020-08-14 NOTE — Patient Instructions (Signed)
Bacterial Vaginosis °Bacterial vaginosis is an infection that occurs when the normal balance of bacteria in the vagina changes. This change is caused by an overgrowth of certain bacteria in the vagina. Bacterial vaginosis is the most common vaginal infection among females aged 27 to 44 years. °This condition increases the risk of sexually transmitted infections (STIs). Treatment can help reduce this risk. Treatment is very important for pregnant women because this condition can cause babies to be born early (prematurely) or at a low birth weight. °What are the causes? °This condition is caused by an increase in harmful bacteria that are normally present in small amounts in the vagina. However, the exact reason this condition develops is not known. °You cannot get bacterial vaginosis from toilet seats, bedding, swimming pools, or contact with objects around you. °What increases the risk? °The following factors may make you more likely to develop this condition: °Having a new sexual partner or multiple sexual partners, or having unprotected sex. °Douching. °Having an intrauterine device (IUD). °Smoking. °Abusing drugs and alcohol. This may lead to riskier sexual behavior. °Taking certain antibiotic medicines. °Being pregnant. °What are the signs or symptoms? °Some women with this condition have no symptoms. Symptoms may include: °Gray or white vaginal discharge. The discharge can be watery or foamy. °A fish-like odor with discharge, especially after sex or during menstruation. °Itching in and around the vagina. °Burning or pain with urination. °How is this diagnosed? °This condition is diagnosed based on: °Your medical history. °A physical exam of the vagina. °Checking a sample of vaginal fluid for harmful bacteria or abnormal cells. °How is this treated? °This condition is treated with antibiotic medicines. These may be given as a pill, a vaginal cream, or a medicine that is put into the vagina (suppository). If the  condition comes back after treatment, a second round of antibiotics may be needed. °Follow these instructions at home: °Medicines °Take or apply over-the-counter and prescription medicines only as told by your health care provider. °Take or apply your antibiotic medicine as told by your health care provider. Do not stop using the antibiotic even if you start to feel better. °General instructions °If you have a female sexual partner, tell her that you have a vaginal infection. She should follow up with her health care provider. If you have a female sexual partner, he does not need treatment. °Avoid sexual activity until you finish treatment. °Drink enough fluid to keep your urine pale yellow. °Keep the area around your vagina and rectum clean. °Wash the area daily with warm water. °Wipe yourself from front to back after using the toilet. °If you are breastfeeding, talk to your health care provider about continuing breastfeeding during treatment. °Keep all follow-up visits. This is important. °How is this prevented? °Self-care °Do not douche. °Wash the outside of your vagina with warm water only. °Wear cotton or cotton-lined underwear. °Avoid wearing tight pants and pantyhose, especially during the summer. °Safe sex °Use protection when having sex. This includes: °Using condoms. °Using dental dams. This is a thin layer of a material made of latex or polyurethane that protects the mouth during oral sex. °Limit the number of sexual partners. To help prevent bacterial vaginosis, it is best to have sex with just one partner (monogamous relationship). °Make sure you and your sexual partner are tested for STIs. °Drugs and alcohol °Do not use any products that contain nicotine or tobacco. These products include cigarettes, chewing tobacco, and vaping devices, such as e-cigarettes. If you need help quitting,   ask your health care provider. °Do not use drugs. °Do not drink alcohol if: °Your health care provider tells you not to  do this. °You are pregnant, may be pregnant, or are planning to become pregnant. °If you drink alcohol: °Limit how much you have to 0-1 drink a day. °Be aware of how much alcohol is in your drink. In the [place] one drink equals one 12 oz bottle of beer (355 mL), one 5 oz glass of wine (148 mL), or one 1½ oz glass of hard liquor (44 mL). °Where to find more information °Centers for Disease Control and Prevention: www.cdc.gov °American Sexual Health Association (ASHA): www.ashastd.org °[place] Department of Health and Human Services, Office on Women's Health: www.womenshealth.gov °Contact a health care provider if: °Your symptoms do not improve, even after treatment. °You have more discharge or pain when urinating. °You have a fever or chills. °You have pain in your abdomen or pelvis. °You have pain during sex. °You have vaginal bleeding between menstrual periods. °Summary °Bacterial vaginosis is a vaginal infection that occurs when the normal balance of bacteria in the vagina changes. It results from an overgrowth of certain bacteria. °This condition increases the risk of sexually transmitted infections (STIs). Getting treated can help reduce this risk. °Treatment is very important for pregnant women because this condition can cause babies to be born early (prematurely) or at low birth weight. °This condition is treated with antibiotic medicines. These may be given as a pill, a vaginal cream, or a medicine that is put into the vagina (suppository). °This information is not intended to replace advice given to you by your health care provider. Make sure you discuss any questions you have with your health care provider. °Document Revised: 08/22/2019 Document Reviewed: 08/22/2019 °Elsevier Patient Education © 2022 [employer] ° °
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
## 2021-03-16 ENCOUNTER — Telehealth: Payer: Self-pay | Admitting: Nurse Practitioner

## 2021-03-16 DIAGNOSIS — N3000 Acute cystitis without hematuria: Secondary | ICD-10-CM

## 2021-03-16 DIAGNOSIS — N760 Acute vaginitis: Secondary | ICD-10-CM

## 2021-03-16 DIAGNOSIS — B9689 Other specified bacterial agents as the cause of diseases classified elsewhere: Secondary | ICD-10-CM

## 2021-03-16 MED ORDER — FLUCONAZOLE 150 MG PO TABS: 150.0000 mg | ORAL_TABLET | Freq: Once | ORAL | 0 refills | Status: AC

## 2021-03-16 MED ORDER — SULFAMETHOXAZOLE-TRIMETHOPRIM 800-160 MG PO TABS: 1.0000 | ORAL_TABLET | Freq: Two times a day (BID) | ORAL | 0 refills | Status: DC

## 2021-03-16 MED ORDER — METRONIDAZOLE 500 MG PO TABS: 500.0000 mg | ORAL_TABLET | Freq: Two times a day (BID) | ORAL | 0 refills | Status: DC

## 2021-03-16 MED ORDER — PHENAZOPYRIDINE HCL 100 MG PO TABS: 100.0000 mg | ORAL_TABLET | Freq: Three times a day (TID) | ORAL | 0 refills | Status: AC | PRN

## 2021-03-16 NOTE — Progress Notes (Signed)
Virtual Visit Consent   [name], you are scheduled for a virtual visit with Mary-Margaret Daphine Deutscher, FNP, a [facility] provider, today.     Just as with appointments in the office, your consent must be obtained to participate.  Your consent will be active for this visit and any virtual visit you may have with one of our providers in the next 365 days.     If you have a MyChart account, a copy of this consent can be sent to you electronically.  All virtual visits are billed to your insurance company just like a traditional visit in the office.    As this is a virtual visit, video technology does not allow for your provider to perform a traditional examination.  This may limit your provider's ability to fully assess your condition.  If your provider identifies any concerns that need to be evaluated in person or the need to arrange testing (such as labs, EKG, etc.), we will make arrangements to do so.     Although advances in technology are sophisticated, we cannot ensure that it will always work on either your end or our end.  If the connection with a video visit is poor, the visit may have to be switched to a telephone visit.  With either a video or telephone visit, we are not always able to ensure that we have a secure connection.     I need to obtain your verbal consent now.   Are you willing to proceed with your visit today? YES   Nicole Novak has provided verbal consent on 03/16/2021 for a virtual visit (video or telephone).   Mary-Margaret Daphine Deutscher, FNP   Date: 03/16/2021 6:10 PM   Virtual Visit via Video Note   I, Mary-Margaret Daphine Deutscher, connected with Nicole Novak ([id number], Feb 09, 1996) on 03/16/21 at  6:15 PM EST by a video-enabled telemedicine application and verified that I am speaking with the correct person using two identifiers.  Location: Patient: Virtual Visit Location Patient: Home Provider: Virtual Visit Location Provider: Mobile   I discussed the limitations of  evaluation and management by telemedicine and the availability of in person appointments. The patient expressed understanding and agreed to proceed.    History of Present Illness: Nicole Novak is a 26 y.o. who identifies as a female who was assigned female at birth, and is being seen today for UTI- possible BV.  HPI: Patient  has been having dysuria that started 2 days ago. Now has frequency and urgency. She has a tendency to get BV and UTI when she has anew partner. Has a foul smell.    Review of Systems  Constitutional:  Negative for diaphoresis and weight loss.  Eyes:  Negative for blurred vision, double vision and pain.  Respiratory:  Negative for shortness of breath.   Cardiovascular:  Negative for chest pain, palpitations, orthopnea and leg swelling.  Gastrointestinal:  Negative for abdominal pain.  Genitourinary:  Positive for dysuria, frequency and urgency. Negative for flank pain and hematuria.  Skin:  Negative for rash.  Neurological:  Negative for dizziness, sensory change, loss of consciousness, weakness and headaches.  Endo/Heme/Allergies:  Negative for polydipsia. Does not bruise/bleed easily.  Psychiatric/Behavioral:  Negative for memory loss. The patient does not have insomnia.   All other systems reviewed and are negative.  Problems: There are no problems to display for this patient.   Allergies:  Allergies  Allergen Reactions   Shellfish Allergy Anaphylaxis   Shellfish Allergy    Medications:  Current Outpatient Medications:    HYDROcodone-acetaminophen (NORCO/VICODIN) 5-325 MG tablet, Take 1 tablet by mouth every 6 (six) hours as needed., Disp: 10 tablet, Rfl: 0   nitrofurantoin, macrocrystal-monohydrate, (MACROBID) 100 MG capsule, Take 1 capsule (100 mg total) by mouth 2 (two) times daily. 1 po BId, Disp: 10 capsule, Rfl: 0   Norethin Ace-Eth Estrad-FE (LOESTRIN FE 1/20 PO), Take by mouth., Disp: , Rfl:    Norethin-Eth Estrad-Fe Biphas (LO LOESTRIN FE PO), Take 1  tablet by mouth daily., Disp: , Rfl:    phenazopyridine (PYRIDIUM) 100 MG tablet, Take 1 tablet (100 mg total) by mouth 3 (three) times daily as needed for pain., Disp: 10 tablet, Rfl: 0   potassium chloride SA (KLOR-CON) 20 MEQ tablet, Take 1 tablet (20 mEq total) by mouth daily., Disp: 3 tablet, Rfl: 0   sulfamethoxazole-trimethoprim (BACTRIM DS) 800-160 MG tablet, Take 1 tablet by mouth 2 (two) times daily., Disp: 14 tablet, Rfl: 0  Observations/Objective: Patient is well-developed, well-nourished in no acute distress.  Resting comfortably  at home.  Head is normocephalic, atraumatic.  No labored breathing.  Speech is clear and coherent with logical content.  Patient is alert and oriented at baseline.    Assessment and Plan:  Nicole Novak in today with chief complaint of Urinary Tract Infection   1. Acute cystitis without hematuria Take medication as prescribe Cotton underwear Take shower not bath Cranberry juice, yogurt Force fluids AZO over the counter X2 days Culture pending RTO prn   2. BV (bacterial vaginosis) No bubble baths Void after intercourse  Meds ordered this encounter  Medications   sulfamethoxazole-trimethoprim (BACTRIM DS) 800-160 MG tablet    Sig: Take 1 tablet by mouth 2 (two) times daily.    Dispense:  14 tablet    Refill:  0    Order Specific Question:   Supervising Provider    Answer:   MILLER, BRIAN [[id number]]   metroNIDAZOLE (FLAGYL) 500 MG tablet    Sig: Take 1 tablet (500 mg total) by mouth 2 (two) times daily.    Dispense:  14 tablet    Refill:  0    Order Specific Question:   Supervising Provider    Answer:   MILLER, BRIAN [[id number]]   fluconazole (DIFLUCAN) 150 MG tablet    Sig: Take 1 tablet (150 mg total) by mouth once for 1 dose.    Dispense:  1 tablet    Refill:  0    Order Specific Question:   Supervising Provider    Answer:   Sabra Heck, BRIAN [[id number]]   phenazopyridine (PYRIDIUM) 100 MG tablet    Sig: Take 1 tablet (100 mg total) by  mouth 3 (three) times daily as needed for pain.    Dispense:  10 tablet    Refill:  0    Order Specific Question:   Supervising Provider    Answer:   Noemi Chapel [[id number]]     Follow Up Instructions: I discussed the assessment and treatment plan with the patient. The patient was provided an opportunity to ask questions and all were answered. The patient agreed with the plan and demonstrated an understanding of the instructions.  A copy of instructions were sent to the patient via MyChart.  The patient was advised to call back or seek an in-person evaluation if the symptoms worsen or if the condition fails to improve as anticipated.  Time:  I spent 14 minutes with the patient via telehealth technology discussing the above problems/concerns.    Mary-Margaret  Hassell Done, [place]

## 2021-03-16 NOTE — Patient Instructions (Signed)
Pregnancy and Urinary Tract Infection A urinary tract infection (UTI) is an infection of any part of the urinary tract. This includes the kidneys, the tubes that connect the kidneys to the bladder (ureters), the bladder, and the tube that carries urine out of the body (urethra). These organs make, store, and get rid of urine in the body. Your health care provider may use other names to describe the infection. An upper UTI affects the ureters and kidneys (pyelonephritis). A lower UTI affects the bladder (cystitis) and urethra (urethritis). Most UTIs are caused by bacteria in the genital area, around the entrance to the urinary tract. These bacteria grow and cause irritation and inflammation of the urinary tract. You are more likely to develop a UTI during pregnancy because: The physical and hormonal changes that your body goes through make it easier for bacteria to get into your urinary tract. Your growing baby puts pressure on your bladder and can affect urine flow. Pregnant women with diabetes are at an increased risk for developing a UTI. It is important to recognize and treat UTIs in pregnancy because they can cause serious complications for both you and your baby. How does this affect me? Symptoms of a UTI include: Needing to urinate right away (urgently) and often, even if urinating a small amount. Pain, burning, or having a hard time passing urine. Blood in the urine. Unusual, cloudy, and bad-smelling urine. Pain in the abdomen or lower back. Vaginal discharge. You may also have: Vomiting or a decreased appetite. Confusion. Irritability or tiredness. A fever. Diarrhea. A low level of red blood cells (anemia). The development of high blood pressure during pregnancy (preeclampsia). How does this affect my baby? An untreated UTI during pregnancy could lead to a kidney infection or an infection throughout the mother's body (systemic infection). This can cause health problems and affect the  baby. Possible complications of an untreated UTI include: Your baby being born before 37 weeks of pregnancy (premature). Your baby being born with a low birth weight. Your baby having a higher risk of having his or her skin or the white parts of the eyes turn yellow (jaundice). What can I do to lower my risk? To prevent a UTI: Do not hold urine for long periods of time. Empty your bladder as soon as you feel the urge. Always wipe from front to back, especially after a bowel movement. Use each tissue one time when you wipe. Empty your bladder after sex. Keep your genital area dry. Drink 6 to 8 glasses of water each day. Do not douche or use deodorant sprays. Wear cotton underwear and loose clothing. How is this treated? Treatment for this condition may include: Antibiotic medicines that are safe to take during pregnancy. Other medicines to treat less common causes of UTI. Follow these instructions at home: If you were prescribed an antibiotic medicine, take it as told by your health care provider. Do not stop using the antibiotic even if you start to feel better. Keep all follow-up visits. This is important. Contact a health care provider if: Your symptoms do not improve or they get worse. You have abnormal vaginal discharge. Get help right away if you: Have a fever. Have nausea and vomiting. Have back or side pain. Have lower belly pain, tightness, or feel contractions in your uterus. Have a gush of fluid from your vagina. Have blood in your urine. Summary A UTI is an infection of any part of the urinary tract, which includes the kidneys, ureters, bladder, and  urethra. Most urinary tract infections are caused by bacteria in your genital area, around the entrance to your urinary tract (urethra). You are more likely to develop a UTI during pregnancy. It is important to recognize and treat UTIs in pregnancy because of the risk of serious complications for both you and your baby. If you  were prescribed an antibiotic medicine, take it as told by your health care provider. Do not stop using the antibiotic even if you start to feel better. This information is not intended to replace advice given to you by your health care provider. Make sure you discuss any questions you have with your health care provider. Document Revised: 10/07/2020 Document Reviewed: 10/07/2020 Elsevier Patient Education  2022 [employer].

## 2021-05-21 ENCOUNTER — Telehealth: Payer: No Typology Code available for payment source | Admitting: Physician Assistant

## 2021-05-21 ENCOUNTER — Encounter: Payer: No Typology Code available for payment source | Admitting: Physician Assistant

## 2021-05-21 DIAGNOSIS — N760 Acute vaginitis: Secondary | ICD-10-CM

## 2021-05-21 MED ORDER — METRONIDAZOLE 500 MG PO TABS: 500.0000 mg | ORAL_TABLET | Freq: Two times a day (BID) | ORAL | 0 refills | Status: AC

## 2021-05-21 NOTE — Progress Notes (Signed)
Duplicate visit  

## 2021-05-21 NOTE — Progress Notes (Signed)

## 2021-08-03 ENCOUNTER — Telehealth: Payer: Managed Care, Other (non HMO) | Admitting: Physician Assistant

## 2021-08-03 ENCOUNTER — Telehealth: Payer: No Typology Code available for payment source | Admitting: Family Medicine

## 2021-08-03 DIAGNOSIS — R300 Dysuria: Secondary | ICD-10-CM

## 2021-08-03 MED ORDER — NITROFURANTOIN MONOHYD MACRO 100 MG PO CAPS: 100.0000 mg | ORAL_CAPSULE | Freq: Two times a day (BID) | ORAL | 0 refills | Status: AC

## 2021-08-03 NOTE — Progress Notes (Signed)
The patient no-showed for appointment despite this provider sending direct link, reaching out via phone with no response and waiting for at least 10 minutes from appointment time for patient to join. They will be marked as a NS for this appointment/time.  ? ?Nicole Gerlich Cody Bishop Vanderwerf, PA-C ? ? ? ?

## 2021-08-03 NOTE — Progress Notes (Signed)
